# Patient Record
Sex: Female | Born: 1965 | Race: White | Hispanic: No | Marital: Married | Smoking: Never smoker
Health system: Southern US, Community
[De-identification: ages and names within clinical notes are randomized; demographics above are authoritative.]

## PROBLEM LIST (undated history)

## (undated) DIAGNOSIS — R569 Unspecified convulsions: Secondary | ICD-10-CM

## (undated) HISTORY — PX: ECTOPIC PREGNANCY SURGERY: SHX613

## (undated) HISTORY — PX: BRAIN SURGERY: SHX531

---

## 2018-07-09 ENCOUNTER — Inpatient Hospital Stay (HOSPITAL_COMMUNITY)
Admission: AD | Admit: 2018-07-09 | Discharge: 2018-07-10 | Disposition: A | Discharge status: Home / Self Care | Payer: PRIVATE HEALTH INSURANCE | Source: Ambulatory Visit | Attending: Obstetrics and Gynecology | Admitting: Obstetrics and Gynecology

## 2018-07-09 ENCOUNTER — Encounter (HOSPITAL_COMMUNITY): Payer: Self-pay | Admitting: *Deleted

## 2018-07-09 DIAGNOSIS — M549 Dorsalgia, unspecified: Secondary | ICD-10-CM | POA: Diagnosis present

## 2018-07-09 DIAGNOSIS — R161 Splenomegaly, not elsewhere classified: Secondary | ICD-10-CM | POA: Insufficient documentation

## 2018-07-09 DIAGNOSIS — K5730 Diverticulosis of large intestine without perforation or abscess without bleeding: Secondary | ICD-10-CM | POA: Diagnosis not present

## 2018-07-09 DIAGNOSIS — Z885 Allergy status to narcotic agent status: Secondary | ICD-10-CM | POA: Diagnosis not present

## 2018-07-09 DIAGNOSIS — Z3202 Encounter for pregnancy test, result negative: Secondary | ICD-10-CM | POA: Insufficient documentation

## 2018-07-09 DIAGNOSIS — R112 Nausea with vomiting, unspecified: Secondary | ICD-10-CM | POA: Diagnosis not present

## 2018-07-09 DIAGNOSIS — N921 Excessive and frequent menstruation with irregular cycle: Secondary | ICD-10-CM

## 2018-07-09 DIAGNOSIS — N920 Excessive and frequent menstruation with regular cycle: Secondary | ICD-10-CM | POA: Diagnosis not present

## 2018-07-09 DIAGNOSIS — K429 Umbilical hernia without obstruction or gangrene: Secondary | ICD-10-CM | POA: Diagnosis not present

## 2018-07-09 HISTORY — DX: Unspecified convulsions: R56.9

## 2018-07-09 LAB — COMPREHENSIVE METABOLIC PANEL
ALBUMIN: 3.6 g/dL (ref 3.5–5.0)
ALK PHOS: 166 U/L — AB (ref 38–126)
ALT: 25 U/L (ref 0–44)
ANION GAP: 12 (ref 5–15)
AST: 23 U/L (ref 15–41)
BUN: 7 mg/dL (ref 6–20)
CALCIUM: 9.4 mg/dL (ref 8.9–10.3)
CHLORIDE: 103 mmol/L (ref 98–111)
CO2: 25 mmol/L (ref 22–32)
Creatinine, Ser: 0.73 mg/dL (ref 0.44–1.00)
GFR calc non Af Amer: >60 mL/min (ref >60)
GLUCOSE: 111 mg/dL — AB (ref 70–99)
Potassium: 3.3 mmol/L — ABNORMAL LOW (ref 3.5–5.1)
SODIUM: 140 mmol/L (ref 135–145)
Total Bilirubin: 0.2 mg/dL — ABNORMAL LOW (ref 0.3–1.2)
Total Protein: 7.5 g/dL (ref 6.5–8.1)

## 2018-07-09 LAB — WET PREP, GENITAL
Clue Cells Wet Prep HPF POC: NONE SEEN
SPERM: NONE SEEN
TRICH WET PREP: NONE SEEN
YEAST WET PREP: NONE SEEN

## 2018-07-09 LAB — URINALYSIS, ROUTINE W REFLEX MICROSCOPIC
BILIRUBIN URINE: NEGATIVE
GLUCOSE, UA: NEGATIVE mg/dL
KETONES UR: NEGATIVE mg/dL
Leukocytes, UA: NEGATIVE
Nitrite: NEGATIVE
PROTEIN: 30 mg/dL — AB
Specific Gravity, Urine: 1.027 (ref 1.005–1.030)
pH: 5 (ref 5.0–8.0)

## 2018-07-09 LAB — TYPE AND SCREEN
ABO/RH(D): O POS
Antibody Screen: NEGATIVE

## 2018-07-09 LAB — CBC
HEMATOCRIT: 40.8 % (ref 36.0–46.0)
Hemoglobin: 12.9 g/dL (ref 12.0–15.0)
MCH: 27.8 pg (ref 26.0–34.0)
MCHC: 31.6 g/dL (ref 30.0–36.0)
MCV: 87.9 fL (ref 80.0–100.0)
NRBC: 0 % (ref 0.0–0.2)
Platelets: 285 10*3/uL (ref 150–400)
RBC: 4.64 MIL/uL (ref 3.87–5.11)
RDW: 14.1 % (ref 11.5–15.5)
WBC: 11.6 10*3/uL — AB (ref 4.0–10.5)

## 2018-07-09 LAB — POCT PREGNANCY, URINE: Preg Test, Ur: NEGATIVE

## 2018-07-09 LAB — LIPASE, BLOOD: Lipase: 23 U/L (ref 11–51)

## 2018-07-09 MED ORDER — HYDROMORPHONE HCL 1 MG/ML IJ SOLN
1.0000 mg | INTRAMUSCULAR | Status: DC | PRN
  Administered 2018-07-09: 1 mg via INTRAVENOUS
  Filled 2018-07-09: qty 1

## 2018-07-09 MED ORDER — SODIUM CHLORIDE 0.9 % IV SOLN
8.0000 mg | Freq: Once | INTRAVENOUS | Status: AC
  Administered 2018-07-09: 8 mg via INTRAVENOUS
  Filled 2018-07-09: qty 4

## 2018-07-09 MED ORDER — LACTATED RINGERS IV SOLN
INTRAVENOUS | Status: DC
  Administered 2018-07-09: 23:00:00 via INTRAVENOUS

## 2018-07-09 MED ORDER — OXYCODONE-ACETAMINOPHEN 5-325 MG PO TABS
2.0000 | ORAL_TABLET | Freq: Once | ORAL | Status: AC
  Administered 2018-07-09: 2 via ORAL
  Filled 2018-07-09: qty 2
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
## 2018-07-09 NOTE — MAU Note (Signed)
Maria Key is a 52 y.o.  here in MAU reporting: that she is pretty sure she had  A miscarriage yesterday. +vaginal bleeding at the time.."bright pink". Now using pads. Was having to use hand towels yesterday. +lower back pain that radiates to front of abdomen. Just moved [place] from [place]. LMP: 03/23/18 Pain score: 10/10. Has not taken any medication today. +nausea Vitals:   07/09/18 1903  BP: (!) 148/86  Pulse: (!) 115  Resp: 18  Temp: 98 F (36.7 C)  SpO2: 99%     Lab orders placed from triage: ua and pregnancy test

## 2018-07-10 ENCOUNTER — Inpatient Hospital Stay (HOSPITAL_COMMUNITY): Payer: PRIVATE HEALTH INSURANCE

## 2018-07-10 LAB — ABO/RH: ABO/RH(D): O POS

## 2018-07-10 MED ORDER — IOPAMIDOL (ISOVUE-300) INJECTION 61%
100.0000 mL | Freq: Once | INTRAVENOUS | Status: AC | PRN
  Administered 2018-07-10: 100 mL via INTRAVENOUS

## 2018-07-10 MED ORDER — IBUPROFEN 600 MG PO TABS: 600.0000 mg | ORAL_TABLET | Freq: Four times a day (QID) | ORAL | 0 refills | Status: AC | PRN

## 2018-07-10 MED ORDER — PROMETHAZINE HCL 25 MG PO TABS: 12.5000 mg | ORAL_TABLET | Freq: Four times a day (QID) | ORAL | 0 refills | Status: AC | PRN

## 2018-07-10 NOTE — Discharge Instructions (Signed)
Diverticulosis  Diverticulosis is a condition that develops when small pouches (diverticula) form in the wall of the large intestine (colon). The colon is where water is absorbed and stool is formed. The pouches form when the inside layer of the colon pushes through weak spots in the outer layers of the colon. You may have a few pouches or many of them.  What are the causes?  The cause of this condition is not known.  What increases the risk?  The following factors may make you more likely to develop this condition:   Being older than age 60. Your risk for this condition increases with age. Diverticulosis is rare among people younger than age 30. By age 80, many people have it.   Eating a low-fiber diet.   Having frequent constipation.   Being overweight.   Not getting enough exercise.   Smoking.   Taking over-the-counter pain medicines, like aspirin and ibuprofen.   Having a family history of diverticulosis.    What are the signs or symptoms?  In most people, there are no symptoms of this condition. If you do have symptoms, they may include:   Bloating.   Cramps in the abdomen.   Constipation or diarrhea.   Pain in the lower left side of the abdomen.    How is this diagnosed?  This condition is most often diagnosed during an exam for other colon problems. Because diverticulosis usually has no symptoms, it often cannot be diagnosed independently. This condition may be diagnosed by:   Using a flexible scope to examine the colon (colonoscopy).   Taking an X-ray of the colon after dye has been put into the colon (barium enema).   Doing a CT scan.    How is this treated?  You may not need treatment for this condition if you have never developed an infection related to diverticulosis. If you have had an infection before, treatment may include:   Eating a high-fiber diet. This may include eating more fruits, vegetables, and grains.   Taking a fiber supplement.   Taking a live bacteria supplement  (probiotic).   Taking medicine to relax your colon.   Taking antibiotic medicines.    Follow these instructions at home:   Drink 6-8 glasses of water or more each day to prevent constipation.   Try not to strain when you have a bowel movement.   If you have had an infection before:  ? Eat more fiber as directed by your health care provider or your diet and nutrition specialist (dietitian).  ? Take a fiber supplement or probiotic, if your health care provider approves.   Take over-the-counter and prescription medicines only as told by your health care provider.   If you were prescribed an antibiotic, take it as told by your health care provider. Do not stop taking the antibiotic even if you start to feel better.   Keep all follow-up visits as told by your health care provider. This is important.  Contact a health care provider if:   You have pain in your abdomen.   You have bloating.   You have cramps.   You have not had a bowel movement in 3 days.  Get help right away if:   Your pain gets worse.   Your bloating becomes very bad.   You have a fever or chills, and your symptoms suddenly get worse.   You vomit.   You have bowel movements that are bloody or black.   You have   bleeding from your rectum.  Summary   Diverticulosis is a condition that develops when small pouches (diverticula) form in the wall of the large intestine (colon).   You may have a few pouches or many of them.   This condition is most often diagnosed during an exam for other colon problems.   If you have had an infection related to diverticulosis, treatment may include increasing the fiber in your diet, taking supplements, or taking medicines.  This information is not intended to replace advice given to you by your health care provider. Make sure you discuss any questions you have with your health care provider.  Document Released: 06/04/2004 Document Revised: 07/27/2016 Document Reviewed: 07/27/2016  Elsevier Interactive  Patient Education  2017 [employer]

## 2018-07-11 LAB — GC/CHLAMYDIA PROBE AMP (~~LOC~~) NOT AT ARMC
CHLAMYDIA, DNA PROBE: NEGATIVE
Neisseria Gonorrhea: NEGATIVE

## 2018-07-12 LAB — HIV ANTIBODY (ROUTINE TESTING W REFLEX): HIV Screen 4th Generation wRfx: NONREACTIVE

## 2018-08-26 ENCOUNTER — Emergency Department
Admission: EM | Admit: 2018-08-26 | Discharge: 2018-08-26 | Disposition: A | Discharge status: Home / Self Care | Payer: No Typology Code available for payment source | Attending: Emergency Medicine | Admitting: Emergency Medicine

## 2018-08-26 ENCOUNTER — Other Ambulatory Visit: Payer: Self-pay

## 2018-08-26 ENCOUNTER — Encounter: Payer: Self-pay | Admitting: Physician Assistant

## 2018-08-26 DIAGNOSIS — F0781 Postconcussional syndrome: Secondary | ICD-10-CM | POA: Diagnosis not present

## 2018-08-26 DIAGNOSIS — G44309 Post-traumatic headache, unspecified, not intractable: Secondary | ICD-10-CM | POA: Diagnosis not present

## 2018-08-26 DIAGNOSIS — M7918 Myalgia, other site: Secondary | ICD-10-CM | POA: Diagnosis not present

## 2018-08-26 DIAGNOSIS — Y998 Other external cause status: Secondary | ICD-10-CM | POA: Diagnosis not present

## 2018-08-26 DIAGNOSIS — Y92410 Unspecified street and highway as the place of occurrence of the external cause: Secondary | ICD-10-CM | POA: Insufficient documentation

## 2018-08-26 DIAGNOSIS — M549 Dorsalgia, unspecified: Secondary | ICD-10-CM | POA: Insufficient documentation

## 2018-08-26 DIAGNOSIS — Y9389 Activity, other specified: Secondary | ICD-10-CM | POA: Diagnosis not present

## 2018-08-26 DIAGNOSIS — R42 Dizziness and giddiness: Secondary | ICD-10-CM | POA: Diagnosis not present

## 2018-08-26 DIAGNOSIS — M542 Cervicalgia: Secondary | ICD-10-CM | POA: Diagnosis present

## 2018-08-26 MED ORDER — METOCLOPRAMIDE HCL 5 MG PO TABS: 5.0000 mg | ORAL_TABLET | Freq: Three times a day (TID) | ORAL | 0 refills | Status: AC | PRN

## 2018-08-26 MED ORDER — ACETAMINOPHEN 325 MG PO TABS
650.0000 mg | ORAL_TABLET | Freq: Once | ORAL | Status: AC
  Administered 2018-08-26: 650 mg via ORAL
  Filled 2018-08-26: qty 2

## 2018-08-26 MED ORDER — METOCLOPRAMIDE HCL 10 MG PO TABS
10.0000 mg | ORAL_TABLET | Freq: Once | ORAL | Status: AC
  Administered 2018-08-26: 10 mg via ORAL
  Filled 2018-08-26: qty 1

## 2018-08-26 MED ORDER — KETOROLAC TROMETHAMINE 10 MG PO TABS: 10.0000 mg | ORAL_TABLET | Freq: Three times a day (TID) | ORAL | 0 refills | Status: AC

## 2018-08-26 MED ORDER — KETOROLAC TROMETHAMINE 30 MG/ML IJ SOLN
30.0000 mg | Freq: Once | INTRAMUSCULAR | Status: AC
  Administered 2018-08-26: 30 mg via INTRAMUSCULAR
  Filled 2018-08-26: qty 1

## 2018-08-26 NOTE — ED Notes (Signed)
Pt was assisted to the restroom via w/c by this RN. Pt c/o of being dizzy when standing up but was able to make it to the restroom with no issue.
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
## 2018-11-22 ENCOUNTER — Other Ambulatory Visit: Payer: Self-pay | Admitting: Family Medicine

## 2018-11-22 DIAGNOSIS — Z1231 Encounter for screening mammogram for malignant neoplasm of breast: Secondary | ICD-10-CM

## 2018-11-30 ENCOUNTER — Other Ambulatory Visit: Payer: Self-pay

## 2018-11-30 ENCOUNTER — Ambulatory Visit
Admission: RE | Admit: 2018-11-30 | Discharge: 2018-11-30 | Disposition: A | Discharge status: Home / Self Care | Payer: BLUE CROSS/BLUE SHIELD | Source: Ambulatory Visit | Attending: Family Medicine | Admitting: Family Medicine

## 2018-11-30 DIAGNOSIS — Z1231 Encounter for screening mammogram for malignant neoplasm of breast: Secondary | ICD-10-CM | POA: Diagnosis not present

## 2018-12-05 ENCOUNTER — Other Ambulatory Visit: Payer: Self-pay | Admitting: Family Medicine

## 2018-12-05 DIAGNOSIS — N6310 Unspecified lump in the right breast, unspecified quadrant: Secondary | ICD-10-CM

## 2018-12-05 DIAGNOSIS — N6320 Unspecified lump in the left breast, unspecified quadrant: Secondary | ICD-10-CM

## 2018-12-05 DIAGNOSIS — R928 Other abnormal and inconclusive findings on diagnostic imaging of breast: Secondary | ICD-10-CM

## 2018-12-09 ENCOUNTER — Other Ambulatory Visit: Payer: Self-pay

## 2018-12-09 ENCOUNTER — Ambulatory Visit
Admission: RE | Admit: 2018-12-09 | Discharge: 2018-12-09 | Disposition: A | Discharge status: Home / Self Care | Payer: BLUE CROSS/BLUE SHIELD | Source: Ambulatory Visit | Attending: Family Medicine | Admitting: Family Medicine

## 2018-12-09 DIAGNOSIS — N6310 Unspecified lump in the right breast, unspecified quadrant: Secondary | ICD-10-CM

## 2018-12-09 DIAGNOSIS — N6320 Unspecified lump in the left breast, unspecified quadrant: Secondary | ICD-10-CM

## 2018-12-09 DIAGNOSIS — R928 Other abnormal and inconclusive findings on diagnostic imaging of breast: Secondary | ICD-10-CM

## 2018-12-16 ENCOUNTER — Other Ambulatory Visit: Payer: Self-pay | Admitting: Family Medicine

## 2018-12-16 DIAGNOSIS — N6310 Unspecified lump in the right breast, unspecified quadrant: Secondary | ICD-10-CM

## 2018-12-16 DIAGNOSIS — N6489 Other specified disorders of breast: Secondary | ICD-10-CM

## 2020-05-06 IMAGING — MG DIGITAL SCREENING BILATERAL MAMMOGRAM WITH TOMO AND CAD
6 series · 6 of 18 positions shown · non-contrast
Comparison: Previous exam(s).

CLINICAL DATA: Screening.

EXAM:
DIGITAL SCREENING BILATERAL MAMMOGRAM WITH TOMO AND CAD

[L MLO synth-2D]
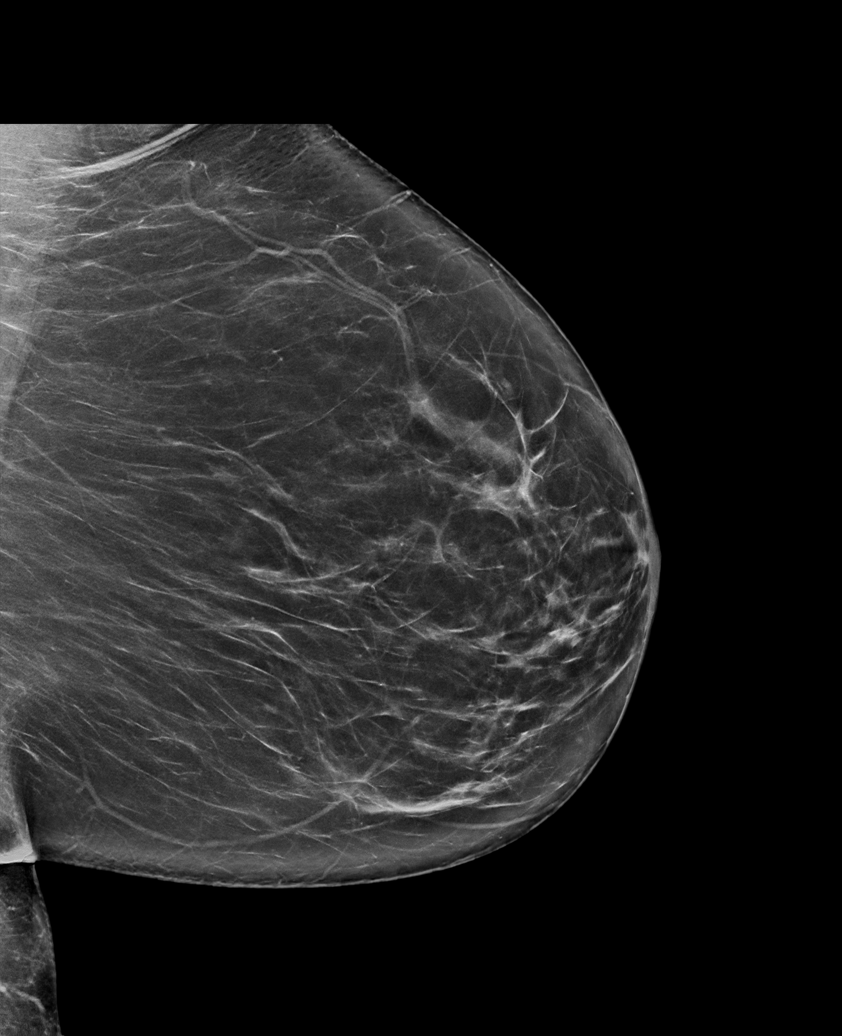

[R CC synth-2D]
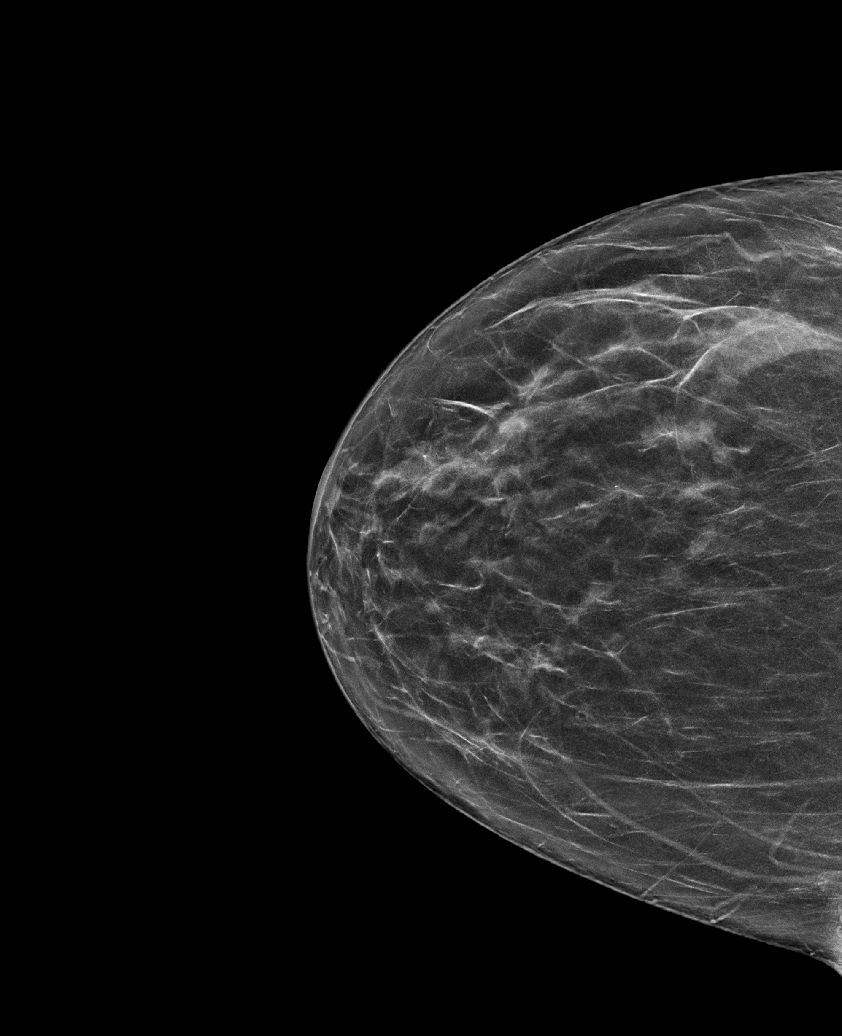

[R MLO synth-2D]
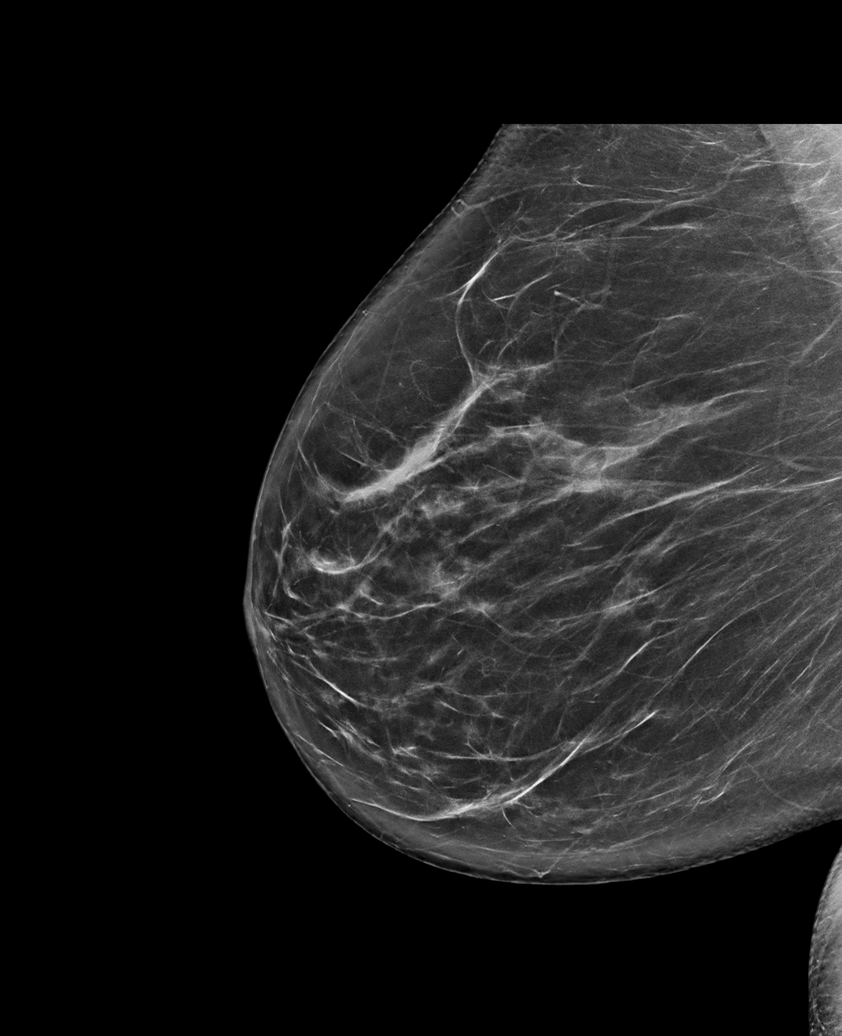

[R MLO tomo · tomo slice 39/78.0]
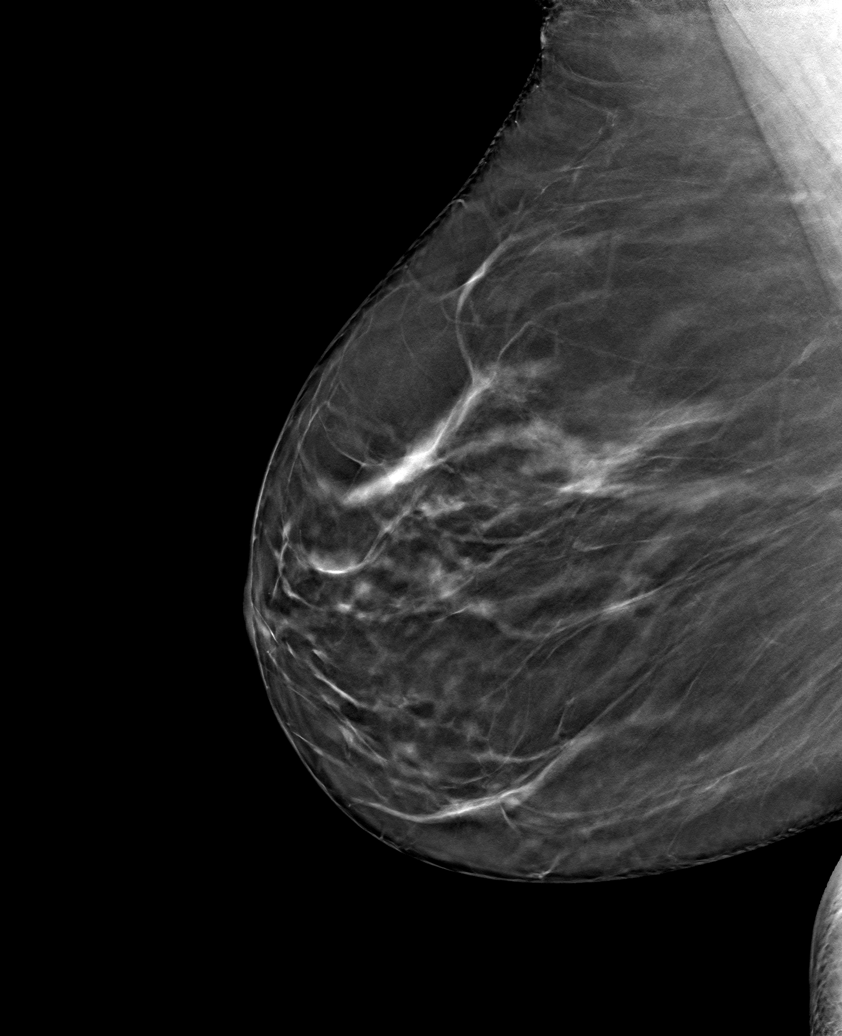

[L MLO tomo · tomo slice 43/84.0]
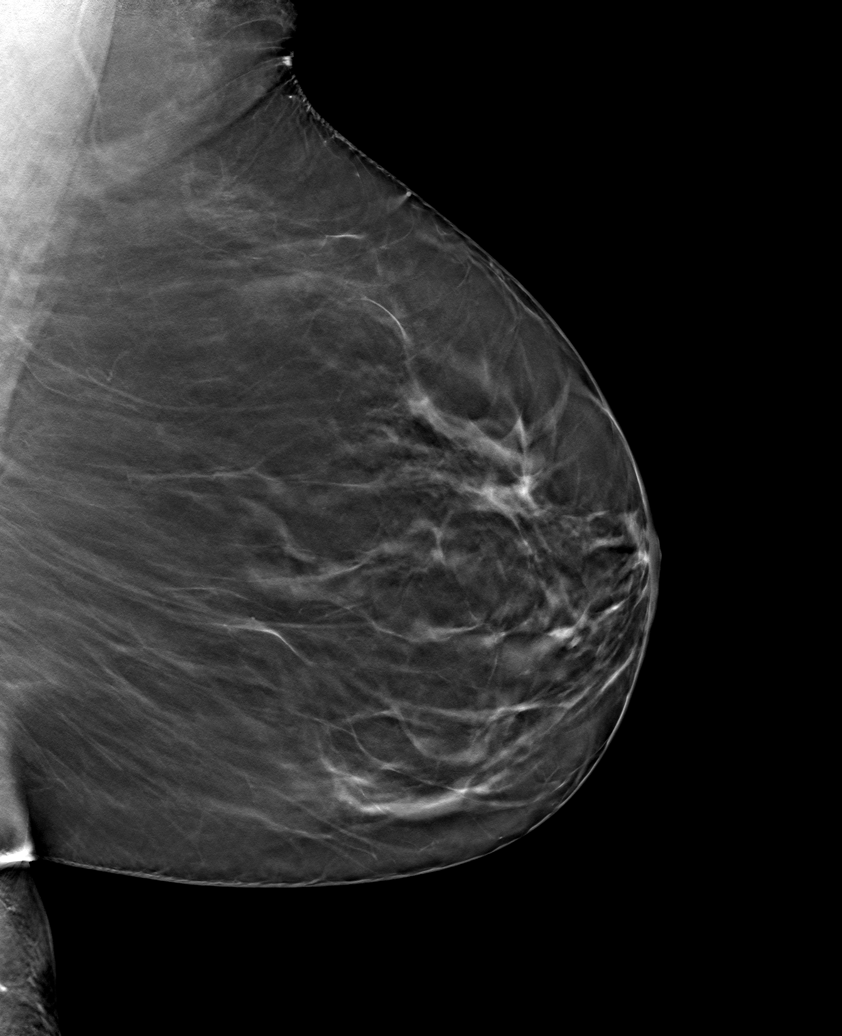

[R CC tomo · tomo slice 35/69.0]
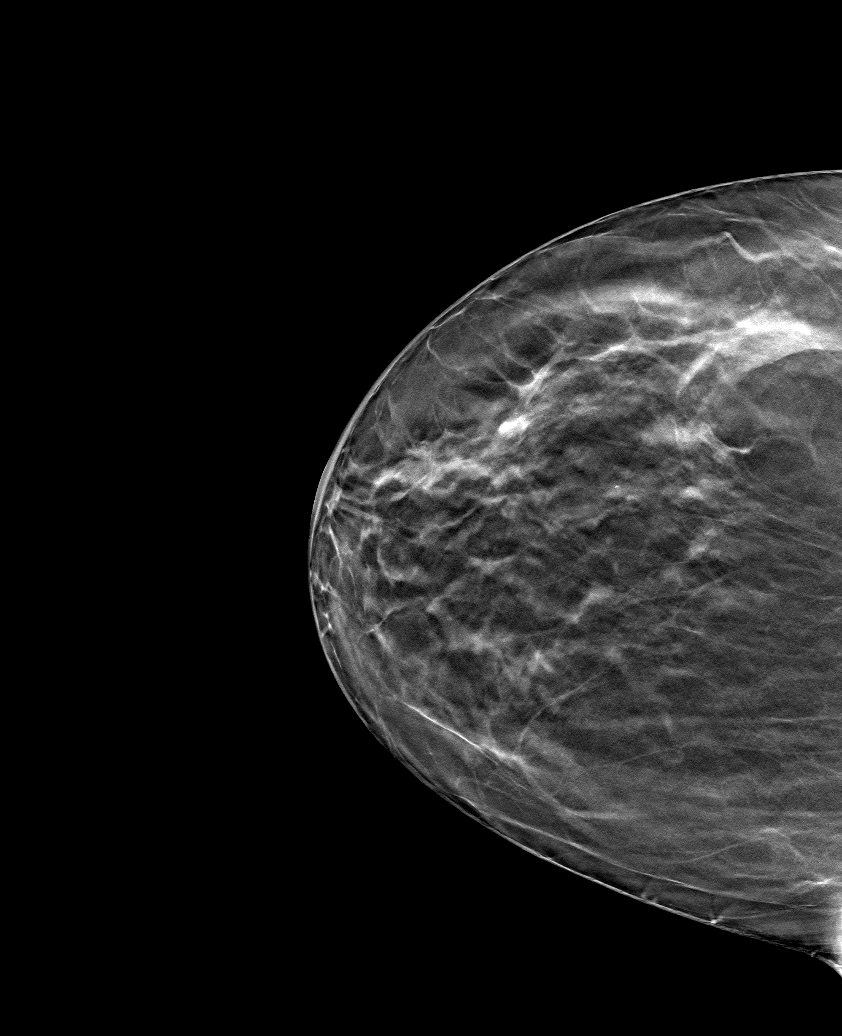

[6 of 18 positions shown; findings below may reference images not displayed]

ACR Breast Density Category b: There are scattered areas of
fibroglandular density.
FINDINGS: In the right breast mass requires further evaluation.

In the left breast mass requires further evaluation.

Images were processed with CAD.
IMPRESSION: Further evaluation is suggested for possible mass in the right
breast.

Further evaluation is suggested for possible mass in the left
breast.

RECOMMENDATION:
Diagnostic mammogram and possibly ultrasound of both breasts.
(Code:S2-A-NN3)

The patient will be contacted regarding the findings, and additional
imaging will be scheduled.

BI-RADS CATEGORY  0: Incomplete. Need additional imaging evaluation
and/or prior mammograms for comparison.
